# Patient Record
Sex: Male | Born: 1985 | Race: Black or African American | Hispanic: No | Marital: Single | Smoking: Never smoker
Health system: Southern US, Community
[De-identification: ages and names within clinical notes are randomized; demographics above are authoritative.]

---

## 2011-12-12 ENCOUNTER — Emergency Department (HOSPITAL_COMMUNITY)
Admission: EM | Admit: 2011-12-12 | Discharge: 2011-12-12 | Disposition: A | Discharge status: Home / Self Care | Payer: No Typology Code available for payment source | Attending: Emergency Medicine | Admitting: Emergency Medicine

## 2011-12-12 ENCOUNTER — Encounter (HOSPITAL_COMMUNITY): Payer: Self-pay | Admitting: Emergency Medicine

## 2011-12-12 ENCOUNTER — Emergency Department (HOSPITAL_COMMUNITY): Payer: Self-pay

## 2011-12-12 DIAGNOSIS — M545 Low back pain, unspecified: Secondary | ICD-10-CM | POA: Insufficient documentation

## 2011-12-12 DIAGNOSIS — J45909 Unspecified asthma, uncomplicated: Secondary | ICD-10-CM | POA: Insufficient documentation

## 2011-12-12 MED ORDER — DIAZEPAM 5 MG PO TABS: 5.0000 mg | ORAL_TABLET | Freq: Two times a day (BID) | ORAL | Status: AC

## 2011-12-12 MED ORDER — IBUPROFEN 600 MG PO TABS: 600.0000 mg | ORAL_TABLET | Freq: Four times a day (QID) | ORAL | Status: AC | PRN
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
## 2011-12-12 NOTE — ED Notes (Signed)
Pt reports r/side and low back pain, denies LOC
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
## 2011-12-14 NOTE — ED Provider Notes (Signed)
History/physical exam/procedure(s) were performed by non-physician practitioner and as supervising physician I was immediately available for consultation/collaboration. I have reviewed all notes and am in agreement with care and plan.   Gloyd Happ S [place] Riesen, MD 12/14/11 1857 

## 2011-12-16 ENCOUNTER — Emergency Department (HOSPITAL_COMMUNITY)
Admission: EM | Admit: 2011-12-16 | Discharge: 2011-12-16 | Disposition: A | Discharge status: Home / Self Care | Payer: Self-pay | Attending: Emergency Medicine | Admitting: Emergency Medicine

## 2011-12-16 ENCOUNTER — Encounter (HOSPITAL_COMMUNITY): Payer: Self-pay | Admitting: Emergency Medicine

## 2011-12-16 DIAGNOSIS — J45909 Unspecified asthma, uncomplicated: Secondary | ICD-10-CM | POA: Insufficient documentation

## 2011-12-16 DIAGNOSIS — R51 Headache: Secondary | ICD-10-CM | POA: Insufficient documentation

## 2011-12-16 MED ORDER — IBUPROFEN 200 MG PO TABS
600.0000 mg | ORAL_TABLET | Freq: Once | ORAL | Status: AC
  Administered 2011-12-16: 600 mg via ORAL
  Filled 2011-12-16: qty 3

## 2011-12-16 MED ORDER — IBUPROFEN 200 MG PO TABS
ORAL_TABLET | ORAL | Status: AC
  Filled 2011-12-16: qty 1

## 2011-12-16 MED ORDER — ACETAMINOPHEN 325 MG PO TABS
650.0000 mg | ORAL_TABLET | Freq: Once | ORAL | Status: AC
  Administered 2011-12-16: 650 mg via ORAL
  Filled 2011-12-16 (×2): qty 1

## 2011-12-16 NOTE — ED Notes (Signed)
Pt presented to the ER with c/o HTN, states started to develop HA while at work, when measured BP was 160/80

## 2011-12-16 NOTE — ED Provider Notes (Signed)
History     CSN: [id number]  Arrival date & time 12/16/11  0025   First MD Initiated Contact with Patient 12/16/11 [phone]      Chief Complaint  Patient presents with  . Hypertension    (Consider location/radiation/quality/duration/timing/severity/associated sxs/prior treatment) The history is provided by the patient.   the patient reports he developed a headache while he was at work today.  He denies weakness of his upper lower extremities.  His had no difficulty with speech.  He checked his blood pressure were conducted it to be 160/80 thus he presents to the emergency department for evaluation of high blood pressure.  He has no history of hypertension.  He is a young gentleman with no medical problems.  He does not have a primary care physician.  He reports a family history of hypertension.  He denies chest pain shortness of breath.  He has not tried any medications for his headache.  His pain is mild to moderate.  No changes in his vision.  His had no recent trauma.  Is not on anticoagulants.  Past Medical History  Diagnosis Date  . Asthma     History reviewed. No pertinent past surgical history.  Family History  Problem Relation Age of Onset  . Hypertension Mother   . Diabetes Other   . Cancer Other     History  Substance Use Topics  . Smoking status: Never Smoker   . Smokeless tobacco: Not on file  . Alcohol Use: No      Review of Systems  All other systems reviewed and are negative.    Allergies  Review of patient's allergies indicates no known allergies.  Home Medications   Current Outpatient Rx  Name Route Sig Dispense Refill  . ACETAMINOPHEN 325 MG PO TABS Oral Take 650 mg by mouth every 6 (six) hours as needed. For pain    . DIAZEPAM 5 MG PO TABS Oral Take 1 tablet (5 mg total) by mouth 2 (two) times daily. 10 tablet 0  . IBUPROFEN 600 MG PO TABS Oral Take 1 tablet (600 mg total) by mouth every 6 (six) hours as needed for pain. 30 tablet 0    BP  149/61  Pulse 55  Temp(Src) 97.9 F (36.6 C) (Oral)  Resp 16  SpO2 100%  Physical Exam  Nursing note and vitals reviewed. Constitutional: He is oriented to person, place, and time. He appears well-developed and well-nourished.  HENT:  Head: Normocephalic and atraumatic.  Eyes: Pupils are equal, round, and reactive to light.  Cardiovascular: Regular rhythm.   Pulmonary/Chest: Effort normal.  Abdominal: Soft.  Neurological: He is alert and oriented to person, place, and time.       5/5 strength in major muscle groups of  bilateral upper and lower extremities. Speech normal. No facial asymetry.     ED Course  Procedures (including critical care time)  Labs Reviewed - No data to display No results found.   1. Headache       MDM  Normal neurologic exam.  His blood pressure in emergency apartment 149/61.  Primary care followup.  His headache was treated.  No indication for imaging.  Nonfocal neuro exam        Lyanne Co, MD 12/16/11 [phone]

## 2016-10-27 ENCOUNTER — Emergency Department (HOSPITAL_COMMUNITY)
Admission: EM | Admit: 2016-10-27 | Discharge: 2016-10-27 | Disposition: A | Discharge status: Home / Self Care | Payer: No Typology Code available for payment source | Attending: Emergency Medicine | Admitting: Emergency Medicine

## 2016-10-27 ENCOUNTER — Encounter (HOSPITAL_COMMUNITY): Payer: Self-pay | Admitting: Emergency Medicine

## 2016-10-27 ENCOUNTER — Emergency Department (HOSPITAL_COMMUNITY): Payer: No Typology Code available for payment source

## 2016-10-27 DIAGNOSIS — Y92410 Unspecified street and highway as the place of occurrence of the external cause: Secondary | ICD-10-CM | POA: Diagnosis not present

## 2016-10-27 DIAGNOSIS — Y999 Unspecified external cause status: Secondary | ICD-10-CM | POA: Insufficient documentation

## 2016-10-27 DIAGNOSIS — J45909 Unspecified asthma, uncomplicated: Secondary | ICD-10-CM | POA: Diagnosis not present

## 2016-10-27 DIAGNOSIS — Z79899 Other long term (current) drug therapy: Secondary | ICD-10-CM | POA: Diagnosis not present

## 2016-10-27 DIAGNOSIS — Y939 Activity, unspecified: Secondary | ICD-10-CM | POA: Insufficient documentation

## 2016-10-27 DIAGNOSIS — S39012A Strain of muscle, fascia and tendon of lower back, initial encounter: Secondary | ICD-10-CM | POA: Diagnosis not present

## 2016-10-27 DIAGNOSIS — S3992XA Unspecified injury of lower back, initial encounter: Secondary | ICD-10-CM | POA: Diagnosis present

## 2016-10-27 MED ORDER — IBUPROFEN 800 MG PO TABS
800.0000 mg | ORAL_TABLET | Freq: Once | ORAL | Status: AC
  Administered 2016-10-27: 800 mg via ORAL
  Filled 2016-10-27: qty 1

## 2016-10-27 MED ORDER — IBUPROFEN 800 MG PO TABS: 800.0000 mg | ORAL_TABLET | Freq: Three times a day (TID) | ORAL | 0 refills | Status: DC

## 2016-10-27 MED ORDER — METHOCARBAMOL 500 MG PO TABS: 500.0000 mg | ORAL_TABLET | Freq: Three times a day (TID) | ORAL | 0 refills | Status: DC | PRN

## 2016-10-27 MED ORDER — METHOCARBAMOL 500 MG PO TABS
1000.0000 mg | ORAL_TABLET | Freq: Once | ORAL | Status: AC
  Administered 2016-10-27: 1000 mg via ORAL
  Filled 2016-10-27: qty 2
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
## 2016-10-27 NOTE — Discharge Instructions (Signed)
Ibuprofen for pain. Robaxin for muscle spasm/pain. Ice to painful areas today.
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
## 2017-09-27 ENCOUNTER — Other Ambulatory Visit: Payer: Self-pay

## 2017-09-27 ENCOUNTER — Ambulatory Visit (HOSPITAL_COMMUNITY)
Admission: EM | Admit: 2017-09-27 | Discharge: 2017-09-27 | Disposition: A | Discharge status: Home / Self Care | Payer: BLUE CROSS/BLUE SHIELD | Attending: Family Medicine | Admitting: Family Medicine

## 2017-09-27 ENCOUNTER — Encounter (HOSPITAL_COMMUNITY): Payer: Self-pay | Admitting: Emergency Medicine

## 2017-09-27 DIAGNOSIS — A09 Infectious gastroenteritis and colitis, unspecified: Secondary | ICD-10-CM | POA: Diagnosis not present

## 2017-09-27 MED ORDER — LOPERAMIDE HCL 2 MG PO CAPS: 2.0000 mg | ORAL_CAPSULE | Freq: Four times a day (QID) | ORAL | 0 refills | Status: AC | PRN

## 2017-09-27 NOTE — ED Triage Notes (Signed)
Pt c/o abdominal cramping and diarrhea since this morning, states hes gone to the bathroom around 10 times today

## 2017-09-27 NOTE — ED Provider Notes (Addendum)
  [facility]   [id number] 09/27/17 Arrival Time: 1838   SUBJECTIVE:  Nicholas Gilbert is a 32 y.o. male who presents to the urgent care with complaint of abdominal cramping and diarrhea since this morning, states hes gone to the bathroom around 10 times today  His son just got over diarrhea.  Past Medical History:  Diagnosis Date  . Asthma    Family History  Problem Relation Age of Onset  . Hypertension Mother   . Diabetes Other   . Cancer Other    Social History   Socioeconomic History  . Marital status: Single    Spouse name: Not on file  . Number of children: Not on file  . Years of education: Not on file  . Highest education level: Not on file  Social Needs  . Financial resource strain: Not on file  . Food insecurity - worry: Not on file  . Food insecurity - inability: Not on file  . Transportation needs - medical: Not on file  . Transportation needs - non-medical: Not on file  Occupational History  . Not on file  Tobacco Use  . Smoking status: Never Smoker  Substance and Sexual Activity  . Alcohol use: No  . Drug use: No  . Sexual activity: Yes    Birth control/protection: Condom  Other Topics Concern  . Not on file  Social History Narrative  . Not on file   No outpatient medications have been marked as taking for the 09/27/17 encounter [facility](Hospital Encounter).   No Known Allergies    ROS: As per HPI, remainder of ROS negative.   OBJECTIVE:   Vitals:   09/27/17 1934  BP: (!) 151/104  Pulse: 85  Resp: 18  Temp: 99.5 F (37.5 C)  SpO2: 99%     General appearance: alert; no distress Eyes: PERRL; EOMI; conjunctiva normal HENT: normocephalic; atraumatic; oral mucosa normal Neck: supple Lungs: clear to auscultation bilaterally Heart: regular rate and rhythm Abdomen: soft, non-tender; bowel sounds normal; no masses or organomegaly; no guarding or rebound tenderness Back: no CVA tenderness Extremities: no cyanosis or edema; symmetrical  with no gross deformities Skin: warm and dry Neurologic: normal gait; grossly normal Psychological: alert and cooperative; normal mood and affect      Labs:  No results found for this or any previous visit.  Labs Reviewed - No data to display  No results found.     ASSESSMENT & PLAN:  1. Diarrhea of infectious origin     Meds ordered this encounter  Medications  . loperamide (IMODIUM) 2 MG capsule    Sig: Take 1 capsule (2 mg total) by mouth 4 (four) times daily as needed for diarrhea or loose stools.    Dispense:  12 capsule    Refill:  0    Reviewed expectations re: course of current medical issues. Questions answered. Outlined signs and symptoms indicating need for more acute intervention. Patient verbalized understanding. After Visit Summary given.    Procedures:      Elvina SidleLauenstein, Devonne Lalani, MD 09/27/17 1944    Elvina SidleLauenstein, Laquinta Hazell, MD 09/27/17 1950

## 2017-11-27 ENCOUNTER — Emergency Department (HOSPITAL_COMMUNITY)
Admission: EM | Admit: 2017-11-27 | Discharge: 2017-11-27 | Disposition: A | Discharge status: Home / Self Care | Payer: BLUE CROSS/BLUE SHIELD | Attending: Emergency Medicine | Admitting: Emergency Medicine

## 2017-11-27 ENCOUNTER — Encounter (HOSPITAL_COMMUNITY): Payer: Self-pay | Admitting: Emergency Medicine

## 2017-11-27 DIAGNOSIS — J45909 Unspecified asthma, uncomplicated: Secondary | ICD-10-CM | POA: Diagnosis not present

## 2017-11-27 DIAGNOSIS — Z79899 Other long term (current) drug therapy: Secondary | ICD-10-CM | POA: Diagnosis not present

## 2017-11-27 DIAGNOSIS — Z041 Encounter for examination and observation following transport accident: Secondary | ICD-10-CM | POA: Diagnosis present

## 2017-11-27 MED ORDER — METHOCARBAMOL 500 MG PO TABS: 500.0000 mg | ORAL_TABLET | Freq: Two times a day (BID) | ORAL | 0 refills | Status: DC
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
## 2017-11-27 NOTE — Discharge Instructions (Addendum)
Please read attached information. If you experience any new or worsening signs or symptoms please return to the emergency room for evaluation. Please follow-up with your primary care provider or specialist as discussed. Please use medication prescribed only as directed and discontinue taking if you have any concerning signs or symptoms.   °
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
## 2019-08-30 ENCOUNTER — Ambulatory Visit: Payer: BC Managed Care – PPO | Attending: Internal Medicine

## 2019-08-30 DIAGNOSIS — Z20822 Contact with and (suspected) exposure to covid-19: Secondary | ICD-10-CM

## 2019-08-31 LAB — NOVEL CORONAVIRUS, NAA: SARS-CoV-2, NAA: NOT DETECTED

## 2021-03-24 ENCOUNTER — Emergency Department (HOSPITAL_COMMUNITY)
Admission: EM | Admit: 2021-03-24 | Discharge: 2021-03-24 | Disposition: A | Discharge status: Home / Self Care | Payer: BC Managed Care – PPO | Attending: Emergency Medicine | Admitting: Emergency Medicine

## 2021-03-24 ENCOUNTER — Other Ambulatory Visit: Payer: Self-pay

## 2021-03-24 ENCOUNTER — Emergency Department (HOSPITAL_COMMUNITY): Payer: BC Managed Care – PPO

## 2021-03-24 ENCOUNTER — Encounter (HOSPITAL_COMMUNITY): Payer: Self-pay | Admitting: Emergency Medicine

## 2021-03-24 DIAGNOSIS — M545 Low back pain, unspecified: Secondary | ICD-10-CM | POA: Diagnosis not present

## 2021-03-24 DIAGNOSIS — R519 Headache, unspecified: Secondary | ICD-10-CM | POA: Diagnosis not present

## 2021-03-24 DIAGNOSIS — J45909 Unspecified asthma, uncomplicated: Secondary | ICD-10-CM | POA: Diagnosis not present

## 2021-03-24 DIAGNOSIS — Y92410 Unspecified street and highway as the place of occurrence of the external cause: Secondary | ICD-10-CM | POA: Insufficient documentation

## 2021-03-24 MED ORDER — METHOCARBAMOL 500 MG PO TABS: 1000.0000 mg | ORAL_TABLET | Freq: Four times a day (QID) | ORAL | 0 refills | Status: AC

## 2021-03-24 MED ORDER — IBUPROFEN 600 MG PO TABS: 600.0000 mg | ORAL_TABLET | Freq: Four times a day (QID) | ORAL | 0 refills | Status: AC | PRN
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
